# Patient Record
Sex: Male | Born: 1985 | Hispanic: Yes | Marital: Married
Health system: Southern US, Community
[De-identification: ages and names within clinical notes are randomized; demographics above are authoritative.]

## PROBLEM LIST (undated history)

## (undated) DIAGNOSIS — I10 Essential (primary) hypertension: Secondary | ICD-10-CM

---

## 2014-11-24 ENCOUNTER — Emergency Department: Payer: Self-pay

## 2014-11-24 ENCOUNTER — Emergency Department
Admission: EM | Admit: 2014-11-24 | Discharge: 2014-11-24 | Disposition: A | Discharge status: Home / Self Care | Payer: Self-pay | Attending: Emergency Medicine | Admitting: Emergency Medicine

## 2014-11-24 ENCOUNTER — Other Ambulatory Visit: Payer: Self-pay

## 2014-11-24 DIAGNOSIS — S0083XA Contusion of other part of head, initial encounter: Secondary | ICD-10-CM | POA: Insufficient documentation

## 2014-11-24 DIAGNOSIS — Y92098 Other place in other non-institutional residence as the place of occurrence of the external cause: Secondary | ICD-10-CM | POA: Insufficient documentation

## 2014-11-24 DIAGNOSIS — S300XXA Contusion of lower back and pelvis, initial encounter: Secondary | ICD-10-CM | POA: Insufficient documentation

## 2014-11-24 DIAGNOSIS — Y9389 Activity, other specified: Secondary | ICD-10-CM | POA: Insufficient documentation

## 2014-11-24 DIAGNOSIS — Y998 Other external cause status: Secondary | ICD-10-CM | POA: Insufficient documentation

## 2014-11-24 DIAGNOSIS — S6992XA Unspecified injury of left wrist, hand and finger(s), initial encounter: Secondary | ICD-10-CM | POA: Insufficient documentation

## 2014-11-24 DIAGNOSIS — S199XXA Unspecified injury of neck, initial encounter: Secondary | ICD-10-CM | POA: Insufficient documentation

## 2014-11-24 DIAGNOSIS — S20211A Contusion of right front wall of thorax, initial encounter: Secondary | ICD-10-CM | POA: Insufficient documentation

## 2014-11-24 DIAGNOSIS — S0990XA Unspecified injury of head, initial encounter: Secondary | ICD-10-CM

## 2014-11-24 DIAGNOSIS — I10 Essential (primary) hypertension: Secondary | ICD-10-CM | POA: Insufficient documentation

## 2014-11-24 DIAGNOSIS — R0781 Pleurodynia: Secondary | ICD-10-CM

## 2014-11-24 DIAGNOSIS — R55 Syncope and collapse: Secondary | ICD-10-CM | POA: Insufficient documentation

## 2014-11-24 DIAGNOSIS — T148XXA Other injury of unspecified body region, initial encounter: Secondary | ICD-10-CM

## 2014-11-24 HISTORY — DX: Essential (primary) hypertension: I10

## 2014-11-24 LAB — CBC
HCT: 44.9 % (ref 40.0–52.0)
Hemoglobin: 14.8 g/dL (ref 13.0–18.0)
MCH: 27.6 pg (ref 26.0–34.0)
MCHC: 33 g/dL (ref 32.0–36.0)
MCV: 83.5 fL (ref 80.0–100.0)
PLATELETS: 181 10*3/uL (ref 150–440)
RBC: 5.37 MIL/uL (ref 4.40–5.90)
RDW: 13.6 % (ref 11.5–14.5)
WBC: 6.5 10*3/uL (ref 3.8–10.6)

## 2014-11-24 LAB — COMPREHENSIVE METABOLIC PANEL
ALT: 43 U/L (ref 17–63)
ANION GAP: 14 (ref 5–15)
AST: 51 U/L — ABNORMAL HIGH (ref 15–41)
Albumin: 4.5 g/dL (ref 3.5–5.0)
Alkaline Phosphatase: 75 U/L (ref 38–126)
BUN: 18 mg/dL (ref 6–20)
CHLORIDE: 106 mmol/L (ref 101–111)
CO2: 19 mmol/L — AB (ref 22–32)
Calcium: 8.4 mg/dL — ABNORMAL LOW (ref 8.9–10.3)
Creatinine, Ser: 1.01 mg/dL (ref 0.61–1.24)
Glucose, Bld: 139 mg/dL — ABNORMAL HIGH (ref 65–99)
POTASSIUM: 3 mmol/L — AB (ref 3.5–5.1)
SODIUM: 139 mmol/L (ref 135–145)
Total Bilirubin: 0.9 mg/dL (ref 0.3–1.2)
Total Protein: 7.6 g/dL (ref 6.5–8.1)

## 2014-11-24 LAB — ETHANOL: ALCOHOL ETHYL (B): 125 mg/dL — AB (ref <5)

## 2014-11-24 MED ORDER — ONDANSETRON HCL 4 MG/2ML IJ SOLN
INTRAMUSCULAR | Status: AC
  Administered 2014-11-24: 4 mg via INTRAVENOUS
  Filled 2014-11-24: qty 2

## 2014-11-24 MED ORDER — ONDANSETRON HCL 4 MG/2ML IJ SOLN
4.0000 mg | Freq: Once | INTRAMUSCULAR | Status: AC
  Administered 2014-11-24 (×2): 4 mg via INTRAVENOUS

## 2014-11-24 MED ORDER — SODIUM CHLORIDE 0.9 % IV SOLN
Freq: Once | INTRAVENOUS | Status: AC
  Administered 2014-11-24: 04:00:00 via INTRAVENOUS

## 2014-11-24 MED ORDER — TRAMADOL HCL 50 MG PO TABS: 50.0000 mg | ORAL_TABLET | Freq: Four times a day (QID) | ORAL | Status: AC | PRN

## 2014-11-24 MED ORDER — MORPHINE SULFATE (PF) 4 MG/ML IV SOLN
4.0000 mg | Freq: Once | INTRAVENOUS | Status: AC
  Administered 2014-11-24: 4 mg via INTRAVENOUS
  Filled 2014-11-24: qty 1
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
## 2014-11-24 NOTE — Discharge Instructions (Signed)
Agresin fsica (Assault, General) Una agresin incluye toda conducta, ya sea intencional o imprudente, que produce como resultado una lesin fsica a otra persona, un dao a la propiedad, o ambas cosas. Aqu se incluye cualquier conducta, intencional o imprudente, que por su naturaleza puede ser comprendida (interpretada) por una persona razonable como un intento de daar a otra persona o a su propiedad. La amenaza puede ser oral o escrita. Puede ser comunicada a travs de correo tradicional, por computadora, fax o telfono. Estas amenazas pueden ser directas o implcitas. ALGUNAS FORMAS DE AGRESIN INCLUYEN:  La agresin fsica a Medical laboratory scientific officeruna persona. Esto incluye tanto amenazas fsicas de infringir dao fsico como tambin:  Abofetear.  Golpear.  Pinchar.  Patear.  Golpes de puo.  Empujar.  Incendio provocado.  Sabotaje.  Daos o destruccin de la propiedad.  Arrojar o golpear objetos.  Vandalismo.  Ensear un arma o un objeto que parezca ser un arma de [place]manera amenazante.  Llevar un arma de fuego de cualquier tipo.  Utilizar un arma para lastimar a alguien.  Utilizar un mayor tamao o fuerza fsica para intimidar a Therapist, artotro.  Realizar gestos intimidatorios o amenazantes.  Intimidar.  Ritos de iniciacin violentos.  El lenguaje intimidatorio, [place]amenazante, hostil o abusivo dirigido a Engineer, maintenance (IT)otra persona.  Comunica la intencin de utilizar violencia contra esa persona. Y lleva a una persona razonable a esperar que tenga lugar un comportamiento violento.  Acechar al Dannielle Burnotro. SI OCURRE NUEVAMENTE:  Si esto ocurriera nuevamente, pida inmediatamente ayuda de emergencia (comunquese al 911 en los [place]  Si alguna persona constituye una amenaza clara e inmediata para su seguridad, busque que las autoridades legales dicten una medida judicial de proteccin o que impidan que esa persona se acerque a usted.  Las agresiones Longs Drug Storesmenos amenazantes pueden ser, al menos, informadas a las  autoridades. PASOS A SEGUIR SI HA OCURRIDO UNA AGRESIN SEXUAL  Dirjase a un rea segura. Puede ser un refugio o Lennie Hummerquedarse con 3M Companyuna amistad. Aljese del rea donde usted ha sido atacado. Un gran porcentaje de las agresiones sexuales son llevadas a cabo por un amigo, un pariente o un socio.  Si el profesional que le asiste le indic medicamentos, tmelos como se los ha prescrito durante todo el tiempo indicado.  Slo tome medicamentos de Sales promotion account executiveventa libre o de prescripcin para Chief Technology Officerel dolor, Environmental health practitionerel malestar o la fiebre, segn le haya indicado el profesional que le asiste.  Si ha entrado en contacto con una enfermedad sexual, averige si se le practicarn pruebas nuevamente. Si el profesional que le asiste est preocupado por el virus del VIH/SIDA, podr solicitarle que contine con las pruebas durante algunos meses.  Para proteger su privacidad, los Levi Straussresultados de los exmenes no sern dados por telfono. Asegrese de Starbucks Corporationobtener los resultados de sus exmenes. Si los Norfolk Southernresultados de los exmenes no estn disponibles durante su visita, arregle una cita con el profesional que le asiste para AES Corporationconocer los resultados. No piense que el resultado es normal si esta informacin no se la brinda el profesional que lo asiste o el establecimiento mdico. Es importante que Boston Scientificobtenga los resultados del Roberdelestudio.  Presente la documentacin apropiada a las autoridades. Esto es Wm. Wrigley Jr. Companyimportante en todos los casos de agresin, incluso en el caso de que hayan ocurrido dentro del grupo familiar o hayan sido cometidos por 3M Companyuna amistad. SOLICITE ATENCIN MDICA SI:  Tiene nuevos problemas debido a las lesiones.  Tiene algn problema que pueda relacionarse con la medicina que est tomando, tal como:  Erupciones.  Picazn.  Hinchazn.  Problemas  para respirar.  Siente dolor de vientre (abdominal), nuseas o si tiene vmitos.  Presenta fiebre.  Necesita apoyo o una remisin a un centro de asistencia para vctimas de violacin. Estos son  centros con personal entrenado que pueden ayudarle a superar esta dura experiencia. SOLICITE ATENCIN MDICA DE INMEDIATO SI:  Teme ser amenazado, golpeado o abusado. En los [place] llame al 911.  Recibe nuevas lesiones relacionadas con abuso.  Comienza a sentir Herbalist intenso en alguna de las zonas lesionadas u observa alguna modificacin en su estado que lo preocupa.  Se desmaya o pierde la conciencia.  Siente falta de aire o Journalist, newspaper. Document Released: 03/08/2005 Document Revised: 05/31/2011 [facility] Patient Information 2015 [place], [place]. This information is not intended to replace advice given to you by your health care provider. Make sure you discuss any questions you have with your health care provider.  Conmocin cerebral (Concussion) Una conmocin cerebral, o traumatismo cerebral cerrado, es una lesin cerebral causada por un golpe directo en la cabeza o por un movimiento rpido y brusco (sacudida) de la cabeza o el cuello. Generalmente no pone en peligro la vida. An as, los efectos de [place] conmocin cerebral pueden ser graves. Si sufri una conmocin cerebral antes, es ms probable que experimente sntomas similares a una conmocin cerebral despus de un golpe directo en la cabeza.  CAUSAS   Un golpe directo en la cabeza, como al chocar contra otro jugador en un partido de ftbol, recibir un golpe en una lucha o golpearse la cabeza contra una superficie dura.  Una sacudida de la cabeza o el cuello que hace que el cerebro se mueva hacia adelante y [place] atrs dentro del crneo, como en un choque automovilstico. SIGNOS Y SNTOMAS  Los signos de una conmocin cerebral pueden ser difciles de Chief Strategy Officer. En un primer momento, los pacientes, familiares y profesionales tal vez no los adviertan. Puede ser que aparentemente est normal pero que acte o se sienta diferente. Por lo general, los sntomas son transitorios pero pueden durar Time Warner, semanas o an ms.  Algunos sntomas pueden aparecer inmediatamente mientras otros pueden manifestarse despus de algunas horas o [address]. Cada lesin en la cabeza es diferente. Los sntomas son:   Dolor de cabeza leve a moderado, que no se [place].  Sensacin de presin dentro de la cabeza.  Presentar ms dificultad que lo habitual para:  Aprender o recordar cosas que ha escuchado.  Responder preguntas.  Prestar atencin o concentrarse.  Organizar las tareas diarias.  Tomar decisiones y USG Corporation.  Lentitud para pensar, actuar o reaccionar, hablar o leer.  Sentirse perdido o confundirse con facilidad.  Sentirse cansado todo el tiempo o con falta de Engineer, drilling (fatigado).  Sentirse somnoliento.  Trastornos del sueo.  Dormir ms que lo habitual.  Dormir menos que lo habitual.  Problemas para conciliar el sueo.  Problemas para dormir (insomnio).  Prdida del equilibrio, sensacin de mareo.  Nuseas o vmitos.  Adormecimiento u hormigueo.  Mayor sensibilidad para:  Los sonidos.  Las luces.  Distracciones.  Problemas visuales o cansancio en los ojos.  Disminucin del sentido del gusto o Cabin crew.  Zumbidos en los odos.  Cambios de humor, como sentirse triste o ansioso.  Irritacin o enojo por cosas pequeas o sin motivos.  Falta de motivacin.  Ver o escuchar cosas que otras personas no ven ni escuchan (alucinaciones). DIAGNSTICO  El mdico diagnosticar una conmocin cerebral basndose en la descripcin de la lesin y los sntomas. Le preguntar  si se desmay (perdi el conocimiento) y si tiene dificultad para recordar los sucesos ocurridos inmediatamente antes y durante el traumatismo.  La evaluacin tambin puede incluir:   Un escaneo cerebral para encontrar signos de lesin cerebral. Aunque los estudios no muestren lesiones, igual puede haber sufrido una conmocin cerebral.  Anlisis de sangre para asegurarse de que no hay otros problemas. TRATAMIENTO   La  mayor parte de las conmociones se tratan en el servicio de emergencias, el rea de atencin de urgencia o el consultorio mdico. Es posible que deba Engineer, maintenance hospital durante la noche para Advertising account planner.  Informe a su mdico si toma medicamentos, incluidos los medicamentos recetados, medicamentos de venta libre y remedios naturales. Algunos medicamentos, como los anticoagulantes y la aspirina, pueden aumentar la probabilidad de sufrir complicaciones. Tambin informe a su mdico si ha bebido alcohol o consume drogas. Esta informacin puede Licensed conveyancer.  El mdico le dar el alta con instrucciones importantes que deber seguir.  La velocidad de recuperacin de una conmocin cerebral depende de varios factores. Entre ellos se incluyen la gravedad de la conmocin cerebral, la zona del cerebro lesionada, la edad y [place] de salud previo a la conmocin cerebral.  La mayora de las personas que han sufrido una lesin leve se recuperan completamente. La recuperacin puede llevar tiempo. En general, la recuperacin es ms lenta en las Smith International. [place]ems, a aquellas personas que ya han sufrido una conmocin cerebral en el pasado les llevar ms tiempo recuperarse de la lesin actual. INSTRUCCIONES PARA EL CUIDADO EN EL HOGAR  Instrucciones generales  Siga cuidadosamente las indicaciones que su mdico le ha dado.  Utilice los medicamentos de venta libre o recetados para Primary school teacher, Environmental health practitioner o la fiebre, segn se lo indique el mdico.  Tome slo los medicamentos que su mdico le haya autorizado.  No beba alcohol hasta que su mdico lo autorice. El alcohol y algunas otras sustancias pueden demorar su recuperacin y ponerlo en riesgo de nuevas lesiones.  Si le resulta ms difcil que lo habitual recordar las cosas, escrbalas.  Trate de hacer una cosa por vez si se distrae con facilidad. Por ejemplo, no mire televisin mientras prepara la cena.  Consulte con  familiares y amigos si debe tomar decisiones importantes.  Cumpla con todas las visitas de control. Se recomienda realizar varias evaluaciones de los sntomas para favorecer su recuperacin.  Controle sus sntomas y pdale a otras personas que tambin lo hagan. En algunos casos pueden aparecer complicaciones luego de una conmocin cerebral. Los adultos mayores con lesin cerebral pueden tener un mayor riesgo de complicaciones graves, como un cogulo sanguneo en el cerebro.  Informe a los [employer], el departamento de enfermera de la escuela, el consejero escolar, Public affairs consultant acerca de su lesin, los sntomas y las restricciones que tiene. Hgales saber lo que puede y lo que no Scientist, product/process development. Ellos debern observar:  Aumento en los problemas de atencin o Librarian, academic.  Ms dificultades para recordar o aprender informacin nueva.  Aumento del tiempo que necesita para completar tareas o encargos.  Aumento de la irritabilidad o disminucin de la capacidad para Social worker.  Aparicin de nuevos sntomas.  Haga reposo. El descanso favorece la curacin del cerebro. Asegrese de que:  Duerme bien por la noche. Evite quedarse despierto muy tarde por la noche.  Se va a dormir a la VF Corporation de [address] y los fines de [place].  Descanse durante el  da. Toma siestas durante el da o momentos de descanso cuando se siente cansado.  Limita las actividades que requieren mucha atencin o Librarian, academic. Estas pueden ser:  Tareas para el hogar o trabajos relacionados con el empleo.  Mirar televisin.  Trabajar en la computadora.  Evite toda situacin en la que se pudiera producir otra lesin cerebral (ftbol americano, hockey, ftbol, bsquet, artes marciales, deportes de nieve cuesta abajo y andar a caballo). El problema empeorar cada vez que experimente una conmocin cerebral. Debe evitar estas actividades hasta que sea evaluado por el mdico correspondiente. Regreso a  las Mining engineer a sus actividades habituales gradualmente, no de [place]. Debe darles al cuerpo y el cerebro su tiempo para recuperarse.  No retome la prctica de deportes ni otras actividades deportivas hasta que su mdico le diga que puede [place].  Consulte a su mdico sobre cundo puede conducir automviles, andar en bicicleta u operar mquinas pesadas. La capacidad para reaccionar puede ser ms lenta luego de una lesin cerebral. Nunca realice estas actividades si se siente mareado.  Pregunte a su mdico cuando podr volver a la escuela o al Aleen Campi. Prevencin de otra conmocin cerebral Es muy importante que evite otra lesin cerebral, especialmente antes de que se haya recuperado. En casos raros, una nueva lesin puede causar daos cerebrales permanentes, hinchazn del cerebro o la muerte. El riesgo es mayor durante los primeros 7 a 10 das despus de una lesin en la cabeza. Evite las lesiones:   Use el cinturn de seguridad al conducir un automvil.  Beba alcohol solo con moderacin.  Use un casco cuando ande en bicicleta, esque, patine o realice actividades similares.  Evite actividades que podran causar una segunda conmocin cerebral, como deportes de contacto o recreativos hasta que su mdico lo autorice.  Implemente medidas de seguridad en el hogar.  Evite el desorden y objetos que puedan ser peligrosos en pisos y escaleras.  Coloque barras en los baos y pasamanos en las escaleras.  Ponga alfombras antideslizantes en pisos y baeras.  Mejore la iluminacin en zonas de penumbra. SOLICITE ATENCIN MDICA SI:   Aumentan sus problemas de atencin o concentracin.  Aumentan sus dificultades para recordar o aprender informacin nueva.  Necesita ms tiempo que antes para completar las tareas o asignaciones.  Aumenta la irritabilidad o disminuye la capacidad para Social worker.  Tiene ms sntomas que antes. Solicite atencin mdica si  presenta alguno de los siguientes sntomas durante ms de 2 semanas despus de su lesin:   Dolor de cabeza que perdura (crnico).  Mareos o problemas con el equilibrio.  Nuseas.  Problemas de visin.  Aumento de la sensibilidad a los ruidos o Statistician.  Depresin y cambios en el estado de nimo.  Ansiedad o irritabilidad.  Problemas de memoria.  Dificultad para concentrarse o Engineer, technical sales.  Problemas para dormir.  Cansancio permanente. SOLICITE ATENCIN MDICA DE INMEDIATO SI:   Los dolores de [place] son intensos o empeoran. Esto puede ser un signo de que hay un cogulo sanguneo en la cabeza.  Siente debilidad (aun si es solo en [place], una pierna o parte del rostro).  Siente entumecimiento.  Le falta coordinacin.  Vomita repetidas veces.  Est ms somnoliento.  Una pupila est ms grande que la otra.  Tiene convulsiones.  Tiene dificultad para hablar.  Siente una confusin que va en aumento. Esto puede ser un signo de que hay un cogulo sanguneo en el cerebro.  Aumenta la intranquilidad, la  agitacin o la irritabilidad.  No puede Nutritional therapist o lugares.  Siente dolor en el cuello.  Le resulta difcil despertarse.  Tiene cambios de conducta inusuales.  Pierde la conciencia. ASEGRESE DE QUE:   Comprende estas instrucciones.  Controlar su afeccin.  Recibir ayuda de inmediato si no mejora o si empeora. Document Released: 02/19/2008 Document Revised: 03/13/2013 [facility] Patient Information 2015 [place], [place]. This information is not intended to replace advice given to you by your health care provider. Make sure you discuss any questions you have with your health care provider.  Contusin (Contusion) Una contusin es un hematoma profundo. Las contusiones son el resultado de una lesin que causa sangrado debajo de la piel. La zona de la contusin puede ponerse [place], [place] o [place]. Las lesiones menores causarn contusiones sin  Engineer, mining, Biomedical engineer las ms graves pueden presentar dolor e inflamacin durante un par de semanas.  CAUSAS  Generalmente, una contusin se debe a un golpe, un traumatismo o una fuerza directa en una zona del cuerpo. SNTOMAS   Hinchazn y enrojecimiento en la zona de la lesin.  Hematomas en la zona de la lesin.  Dolor con la palpacin y sensibilidad en la zona de la lesin.  Dolor. DIAGNSTICO  Se puede establecer el diagnstico al hacer una historia clnica y un examen fsico. Nicanor Bake vez sea necesario hacer una radiografa, una tomografa computarizada o una resonancia magntica para determinar si hay lesiones asociadas, como fracturas. TRATAMIENTO  El tratamiento especfico depender de la zona del cuerpo donde se produjo la lesin. En general, el mejor tratamiento para una contusin es el reposo, la aplicacin de hielo, la elevacin de la zona y la aplicacin de compresas fras en la zona de la lesin. Para calmar el dolor tambin podrn recomendarle medicamentos de venta libre. Pregntele al mdico cul es el mejor tratamiento para su contusin. INSTRUCCIONES PARA EL CUIDADO EN EL HOGAR   Aplique hielo sobre la zona lesionada.  Ponga el hielo en una bolsa plstica.  Colquese una toalla entre la piel y la bolsa de hielo.  Deje el hielo durante 15 a , 3 a 4veces por da, o segn las indicaciones del mdico.  Utilice los medicamentos de venta libre o recetados para Primary school teacher, el malestar o la [place], segn se lo indique el mdico. El mdico podr indicarle que evite tomar antiinflamatorios (aspirina, ibuprofeno y naproxeno) durante 48 horas ya que estos medicamentos pueden aumentar los hematomas.  Mantenga la zona de la lesin en reposo.  Si es posible, eleve la zona de la lesin para reducir la hinchazn. SOLICITE ATENCIN MDICA DE INMEDIATO SI:   El hematoma o la hinchazn aumentan.  Siente dolor que [place].  La hinchazn o el dolor no se [employer]. ASEGRESE DE QUE:   Comprende estas instrucciones.  Controlar su afeccin.  Recibir ayuda de inmediato si no mejora o si empeora. Document Released: 12/16/2004 Document Revised: 03/13/2013 [facility] Patient Information 2015 [place], [place]. This information is not intended to replace advice given to you by your health care provider. Make sure you discuss any questions you have with your health care provider.  Contusin facial o en el cuero cabelludo ( Facial or Scalp Contusion) Se llama contusin facial o en el cuero cabelludo cuando se recibe un fuerte golpe en la cara o la cabeza. Las lesiones de la cara y la cabeza generalmente causan una gran hinchazn, especialmente alrededor de los ojos. Las contusiones son el resultado de una lesin que causa  sangrado debajo de la piel. La zona de la contusin puede ponerse [place], [place] o [place]. Las lesiones menores causarn contusiones sin Engineer, mining, Biomedical engineer las ms graves pueden presentar dolor e inflamacin durante un par de semanas.  CAUSAS  La causa de una contusin facial o en el cuero cabelludo suele ser un traumatismo o lesin contundente en la zona del rostro o la cabeza.  SIGNOS Y SNTOMAS   Hinchazn de la zona lesionada.  Cambio de color de la zona lesionada.  Sensibilidad o dolor en la zona lesionada. DIAGNSTICO  Se puede establecer un diagnstico al hacer una historia clnica y un examen fsico. Podra ser necesario tomar una radiografa, tomografa computarizada (TC) o una resonancia magntica (RM) para determinar si hubo lesiones asociadas, como por ejemplo huesos rotos (fracturas). TRATAMIENTO  Frecuentemente, el mejor tratamiento para una contusin facial o del cuero cabelludo es la aplicacin de compresas fras en la zona lesionada. Para calmar el dolor tambin podrn recomendarle medicamentos de venta libre.  INSTRUCCIONES PARA EL CUIDADO EN EL HOGAR   Tome solo medicamentos de venta libre o recetados, segn las  indicaciones del mdico.  Aplique hielo sobre la zona lesionada.  Ponga el hielo en una bolsa plstica.  Coloque una toalla entre la piel y la bolsa de hielo.  Deje el hielo durante 20 minutos, 2 a 3 veces por da. SOLICITE ATENCIN MDICA SI:  Tiene dificultad para morder.  Siente dolor al [employer].  Est preocupado por las imperfecciones que pueden quedar en la cara. SOLICITE ATENCIN MDICA DE INMEDIATO SI:  Siente algn dolor intenso o le duele la cabeza y no se calma con los medicamentos.  Observa cambios en la personalidad, somnolencia o confusin que no son habituales.  Vomita.  Tiene una hemorragia nasal persistente.  Tiene visin doble o visin borrosa.  Supura lquido por la nariz o el odo.  Tiene dificultad para caminar o para usar los brazos o las piernas. ASEGRESE DE QUE:   Comprende estas instrucciones.  Controlar su afeccin.  Recibir ayuda de inmediato si no mejora o si empeora. Document Released: 03/08/2005 Document Revised: 12/27/2012 [facility] Patient Information 2015 [place], [place]. This information is not intended to replace advice given to you by your health care provider. Make sure you discuss any questions you have with your health care provider.  Dolor msculoesqueltico (Musculoskeletal Pain) El dolor musculoesqueltico se siente en huesos y msculos. El dolor puede ocurrir en cualquier parte del cuerpo. El profesional que lo asiste podr tratarlo sin Geologist, engineering causa del dolor. Lo tratar Time Warner de laboratorio (sangre y [place]), las radiografas y otros estudios sean normales. La causa de estos dolores puede ser un virus.  CAUSAS Generalmente no existe una causa definida para este trastorno. Tambin el Citigroup puede deberse a la [place]. En la actividad excesiva se incluye el hacer ejercicios fsicos muy intensos cuando no se est en buena forma. El dolor de huesos tambin puede deberse a cambios climticos. Los huesos  son sensibles a los cambios en la presin atmosfrica. INSTRUCCIONES PARA EL CUIDADO DOMICILIARIO  Para proteger su privacidad, no se entregarn los [employer] pruebas por telfono. Asegrese de conseguirlos. Consulte el modo en que podr obtenerlos si no se lo han informado. Es su responsabilidad contar con los [employer] Corporation.  Utilice los medicamentos de venta libre o de prescripcin para Chief Technology Officer, Environmental health practitioner o la [place], segn se lo indique el profesional que lo asiste. Si le han administrado medicamentos, no conduzca,  no opere maquinarias ni herramientas elctricas, y tampoco firme documentos legales durante 24 horas. No beba alcohol. No tome pldoras para dormir ni otros medicamentos que Museum/gallery curator.  Podr seguir con todas las actividades a menos que stas le ocasionen ms [employer]ck & [employer]. Cuando el dolor disminuya, es importante que gradualmente reanude toda la rutina habitual. Retome las actividades comenzando lentamente. Aumente gradualmente la intensidad y la duracin de sus actividades o del ejercicio.  Durante los perodos de dolor intenso, el reposo en cama puede ser beneficioso. Recustese o sintese en la posicin que le sea ms cmoda.  Coloque hielo sobre la zona afectada.  Ponga hielo en [place] Shut[place].  Colquese una toalla entre la piel y la bolsa de hielo.  Aplique el hielo durante 10 a 20 minutos 3  4 veces por da.  Si el dolor empeora, o no desaparece puede ser [employer] repetir las pruebas o Education officer, environmental nuevos exmenes. El profesional que lo asiste podr requerir investigar ms profundamente para Veterinary surgeon causa posible. SOLICITE ATENCIN MDICA DE INMEDIATO SI:  Siente que el dolor empeora y no se alivia con los medicamentos.  Siente dolor en el pecho asociado a falta de aire, sudoracin, nuseas o vmitos.  El dolor se localiza en el abdomen.  Comienza a sentir nuevos sntomas que parecen ser diferentes o que lo  preocupan. ASEGRESE DE QUE:   Comprende las instrucciones para el alta mdica.  Controlar su enfermedad.  Solicitar atencin mdica de inmediato segn las indicaciones. Document Released: 12/16/2004 Document Revised: 05/31/2011 [facility] Patient Information 2015 [place], [place]. This information is not intended to replace advice given to you by your health care provider. Make sure you discuss any questions you have with your health care provider.  Contusin en las costillas  (Rib Contusion)  Una contusin en las costillas (hematoma) sucede como consecuencia de un golpe en el trax o una cada contra un objeto duro. Generalmente la mejora llega luego de un par de semanas. Si hoy le tomaron radiografas y no hallaron fracturas (ruptura de los [place]), se realiza el diagnstico de contusin. Sin embargo, English as a second language teacher la fractura de las costillas no se observa hasta que pasan [address], o puede descubrirse mucho ms tarde, con una radiografa de rutina en la que se vern signos de curacin. Si esto le ocurre, no significa que hubo un error al observar las radiografas, sino simplemente que no se manifest en las primeras placas. En general, el diagnstico precoz no modifica el tratamiento. INSTRUCCIONES PARA EL CUIDADO DOMICILIARIO  Evite las actividades extenuantes. Realice las tareas con cuidado y evite golpearse las costillas lesionadas. Trate en lo posible de no realizar actividades que presionen las costillas lesionadas y Freight forwarder.  Durante uno o [place], [place] ser beneficioso colocar una bolsa con hielo cada veinte minutos mientras est despierto. Coloque el hielo en una bolsa plstica y ponga una toalla entre la bolsa y la piel.  Consuma una dieta normal y bien balanceada. Beba gran cantidad de lquidos para evitar la constipacin.  Respire profundo varias veces al da para [employer] pulmones libres de infecciones. Trate de toser Engineer, maintenance. Sostenga el rea  lesionada con una almohada mientras tose para Engineer, materials. Expectorar puede ayudarlo a prevenir la neumona.  Utilice un sostn de costillas o faja SLO SI se lo ha indicado el mdico. Si los [place], Photographer los ejercicios de respiracin segn se le haya indicado. Si no se utilizan adecuadamente, los cinturones o fajas  restringen la respiracin y pueden causar una neumona.  Solo tome medicamentos que se pueden comprar sin receta o recetados para Chief Technology Officer, Dentist o fiebre, como le indica el mdico. SOLICITE ATENCIN MDICA SI:  Daphane Shepherd o su nio tienen una temperatura oral de ms de 38,9 C (102 F).  El beb tiene ms de 3 meses y su temperatura rectal es de 100.5 F (38.1 C) o ms durante ms de 1 da.  Aparece tos continua, asociada a esputo espeso o sanguinolento. SOLICITE ATENCIN MDICA DE INMEDIATO SI:  Presenta dificultades respiratorias.  Siente nuseas (ganas de vomitar), tiene vmitos o dolor abdominal (en el vientre).  El dolor empeora y no puede controlarlo con los medicamentos o hay un cambio en la ubicacin del dolor.  Comienza a sentir sudoraciones o Chief Technology Officer se [employer], la [place] o los hombros, o siente mareos o se [place].  Usted o su nio tienen una temperatura oral de ms de 38,9 C (102 F) y no puede controlarla con medicamentos.  Su beb tiene ms de 3 meses y su temperatura rectal es de 102 F (38.9 C) o ms.  Su beb tiene 3 meses o menos y su temperatura rectal es de 100.4 F (38 C) o ms. EST SEGURO QUE:   Comprende las instrucciones para el alta mdica.  Controlar su enfermedad.  Solicitar atencin mdica de inmediato segn las indicaciones. Document Released: 12/16/2004 Document Revised: 05/31/2011 [facility] Patient Information 2015 [place], [place]. This information is not intended to replace advice given to you by your health care provider. Make sure you discuss any questions you have with your health care  provider.

## 2014-11-24 NOTE — ED Notes (Signed)
Patient transported to CT via stretcher.

## 2014-11-24 NOTE — ED Notes (Signed)
MD at bedside for reeval
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
## 2014-11-24 NOTE — ED Notes (Signed)
Patient transported to X-ray via stretcher 
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
